# Patient Record
Sex: Female | Born: 1990
Health system: Southern US, Community
[De-identification: ages and names within clinical notes are randomized; demographics above are authoritative.]

## PROBLEM LIST (undated history)

## (undated) DIAGNOSIS — E669 Obesity, unspecified: Secondary | ICD-10-CM

## (undated) HISTORY — PX: TOE SURGERY: SHX1073

---
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
## 2013-08-02 ENCOUNTER — Emergency Department (HOSPITAL_BASED_OUTPATIENT_CLINIC_OR_DEPARTMENT_OTHER)
Admission: EM | Admit: 2013-08-02 | Discharge: 2013-08-02 | Disposition: A | Discharge status: Home / Self Care | Payer: Medicaid Other | Attending: Emergency Medicine | Admitting: Emergency Medicine

## 2013-08-02 ENCOUNTER — Emergency Department (HOSPITAL_BASED_OUTPATIENT_CLINIC_OR_DEPARTMENT_OTHER): Payer: Medicaid Other

## 2013-08-02 ENCOUNTER — Encounter (HOSPITAL_BASED_OUTPATIENT_CLINIC_OR_DEPARTMENT_OTHER): Payer: Self-pay | Admitting: Emergency Medicine

## 2013-08-02 DIAGNOSIS — IMO0002 Reserved for concepts with insufficient information to code with codable children: Secondary | ICD-10-CM | POA: Insufficient documentation

## 2013-08-02 DIAGNOSIS — Z3202 Encounter for pregnancy test, result negative: Secondary | ICD-10-CM | POA: Insufficient documentation

## 2013-08-02 DIAGNOSIS — Z87891 Personal history of nicotine dependence: Secondary | ICD-10-CM | POA: Insufficient documentation

## 2013-08-02 DIAGNOSIS — Z79899 Other long term (current) drug therapy: Secondary | ICD-10-CM | POA: Insufficient documentation

## 2013-08-02 DIAGNOSIS — Z792 Long term (current) use of antibiotics: Secondary | ICD-10-CM | POA: Insufficient documentation

## 2013-08-02 DIAGNOSIS — L0390 Cellulitis, unspecified: Secondary | ICD-10-CM

## 2013-08-02 MED ORDER — CEPHALEXIN 500 MG PO CAPS: 500.0000 mg | ORAL_CAPSULE | Freq: Four times a day (QID) | ORAL | Status: DC

## 2013-08-02 MED ORDER — SULFAMETHOXAZOLE-TRIMETHOPRIM 800-160 MG PO TABS: 1.0000 | ORAL_TABLET | Freq: Two times a day (BID) | ORAL | Status: DC

## 2013-08-02 MED ORDER — IBUPROFEN 400 MG PO TABS: 400.0000 mg | ORAL_TABLET | Freq: Four times a day (QID) | ORAL | Status: DC | PRN

## 2013-08-02 NOTE — Discharge Instructions (Signed)
RETURN TO THE ER IF THE SYMPTOMS ARE GETTING WORSE, OTHERWISE TAKE THE ANTIBIOTICS AND SEE A PRIMARY CARE DOCTOR IN 1 WEEK.   Cellulitis Cellulitis is an infection of the skin and the tissue beneath it. The infected area is usually red and tender. Cellulitis occurs most often in the arms and lower legs.  CAUSES  Cellulitis is caused by bacteria that enter the skin through cracks or cuts in the skin. The most common types of bacteria that cause cellulitis are Staphylococcus and Streptococcus. SYMPTOMS   Redness and warmth.  Swelling.  Tenderness or pain.  Fever. DIAGNOSIS  Your caregiver can usually determine what is wrong based on a physical exam. Blood tests may also be done. TREATMENT  Treatment usually involves taking an antibiotic medicine. HOME CARE INSTRUCTIONS   Take your antibiotics as directed. Finish them even if you start to feel better.  Keep the infected arm or leg elevated to reduce swelling.  Apply a warm cloth to the affected area up to 4 times per day to relieve pain.  Only take over-the-counter or prescription medicines for pain, discomfort, or fever as directed by your caregiver.  Keep all follow-up appointments as directed by your caregiver. SEEK MEDICAL CARE IF:   You notice red streaks coming from the infected area.  Your red area gets larger or turns dark in color.  Your bone or joint underneath the infected area becomes painful after the skin has healed.  Your infection returns in the same area or another area.  You notice a swollen bump in the infected area.  You develop new symptoms. SEEK IMMEDIATE MEDICAL CARE IF:   You have a fever.  You feel very sleepy.  You develop vomiting or diarrhea.  You have a general ill feeling (malaise) with muscle aches and pains. MAKE SURE YOU:   Understand these instructions.  Will watch your condition.  Will get help right away if you are not doing well or get worse. Document Released: 07/21/2005  Document Revised: 04/11/2012 Document Reviewed: 12/27/2011 [facility] Patient Information 2014 [place], [place].

## 2013-08-02 NOTE — ED Notes (Signed)
Redness and pain to left upper arm.  States she thinks she was bitten by a spider Monday while cleaning her room.

## 2013-08-02 NOTE — ED Notes (Signed)
MD at bedside. 

## 2013-08-02 NOTE — ED Provider Notes (Signed)
CSN: [id number]     Arrival date & time 08/02/13  1023 History   First MD Initiated Contact with Patient 08/02/13 1036     Chief Complaint  Patient presents with  . Insect Bite   (Consider location/radiation/quality/duration/timing/severity/associated sxs/prior Treatment) HPI Comments: Pt comes in with cc of insect bite. Pt reports noticing a bump 2 days ago, which quickly spread yday. She suspects an insect biye on Monday. The rash is on the left forearm, no hx of immunocompromised status or ivda. No n/v/f/c.  The history is provided by the patient.    History reviewed. No pertinent past medical history. Past Surgical History  Procedure Laterality Date  . Toe surgery     No family history on file. History  Substance Use Topics  . Smoking status: Former Games developer  . Smokeless tobacco: Not on file  . Alcohol Use: No   OB History   Grav Para Term Preterm Abortions TAB SAB Ect Mult Living                 Review of Systems  Constitutional: Negative for fever, chills and activity change.  Respiratory: Negative for shortness of breath.   Cardiovascular: Negative for chest pain.  Gastrointestinal: Negative for nausea, vomiting and abdominal pain.  Genitourinary: Negative for dysuria.  Musculoskeletal: Negative for neck pain.  Skin: Positive for rash.  Neurological: Negative for headaches.    Allergies  Review of patient's allergies indicates no known allergies.  Home Medications   Current Outpatient Rx  Name  Route  Sig  Dispense  Refill  . cephALEXin (KEFLEX) 500 MG capsule   Oral   Take 1 capsule (500 mg total) by mouth 4 (four) times daily.   40 capsule   0   . ibuprofen (ADVIL,MOTRIN) 400 MG tablet   Oral   Take 1 tablet (400 mg total) by mouth every 6 (six) hours as needed for pain.   30 tablet   0   . sulfamethoxazole-trimethoprim (SEPTRA DS) 800-160 MG per tablet   Oral   Take 1 tablet by mouth 2 (two) times daily.   20 tablet   0    BP 101/53  Pulse  72  Temp(Src) 98.3 F (36.8 C) (Oral)  Resp 16  Ht 5\' 5"  (1.651 m)  Wt 190 lb (86.183 kg)  BMI 31.62 kg/m2  SpO2 99%  LMP 07/03/2013 Physical Exam  Nursing note and vitals reviewed. Constitutional: She is oriented to person, place, and time. She appears well-developed and well-nourished.  HENT:  Head: Normocephalic and atraumatic.  Eyes: EOM are normal. Pupils are equal, round, and reactive to light.  Neck: Neck supple.  Cardiovascular: Normal rate, regular rhythm and normal heart sounds.   No murmur heard. Pulmonary/Chest: Effort normal. No respiratory distress.  Abdominal: Soft. She exhibits no distension. There is no tenderness. There is no rebound and no guarding.  Neurological: She is alert and oriented to person, place, and time.  Skin: Skin is warm and dry.  Left arm - 50% of it is covered in erythematous lesion, with some induration. Warm to touch. No crepitus, fluctuance, drainage. Elbow ROm is intact. The erythema is marked by [place] no circumferential.     ED Course  Procedures (including critical care time) Labs Review Labs Reviewed  PREGNANCY, URINE   Imaging Review Dg Humerus Left  08/02/2013   CLINICAL DATA:  Pain/redness to posterior distal humerus, implanted contraception in arm  EXAM: LEFT HUMERUS - 2+ VIEW  COMPARISON:  None.  FINDINGS: No fracture or dislocation is seen.  The joint spaces are preserved.  Radiopaque contraceptive device within the subcutaneous tissues along the medial/dorsal aspect of the distal humerus.  IMPRESSION: No acute osseous abnormality is seen.  Radiopaque contraceptive device within the subcutaneous tissues along the medial/dorsal aspect of the distal humerus.   Electronically Signed   By: Charline Bills M.D.   On: 08/02/2013 11:31    EKG Interpretation   None       MDM   1. Cellulitis    Pt comes in with cc of skin redness. Pt has cellulitis. Xray ordered, and labs ordered - only as she reports quick spread of the rash.  No free air, doubt nec fascitis, as patient non toxic and immunocompetent.  Likely cellulitis. Will tx. Return precautions discussed.    Derwood Kaplan, MD 08/02/13 [phone]

## 2015-06-26 ENCOUNTER — Emergency Department (HOSPITAL_BASED_OUTPATIENT_CLINIC_OR_DEPARTMENT_OTHER)
Admission: EM | Admit: 2015-06-26 | Discharge: 2015-06-27 | Disposition: A | Discharge status: Home / Self Care | Payer: Medicaid Other | Attending: Emergency Medicine | Admitting: Emergency Medicine

## 2015-06-26 ENCOUNTER — Encounter (HOSPITAL_BASED_OUTPATIENT_CLINIC_OR_DEPARTMENT_OTHER): Payer: Self-pay | Admitting: *Deleted

## 2015-06-26 DIAGNOSIS — Z794 Long term (current) use of insulin: Secondary | ICD-10-CM | POA: Diagnosis not present

## 2015-06-26 DIAGNOSIS — R112 Nausea with vomiting, unspecified: Secondary | ICD-10-CM | POA: Diagnosis present

## 2015-06-26 DIAGNOSIS — R197 Diarrhea, unspecified: Secondary | ICD-10-CM | POA: Diagnosis not present

## 2015-06-26 DIAGNOSIS — Z3202 Encounter for pregnancy test, result negative: Secondary | ICD-10-CM | POA: Diagnosis not present

## 2015-06-26 DIAGNOSIS — Z720 Tobacco use: Secondary | ICD-10-CM | POA: Diagnosis not present

## 2015-06-26 LAB — URINALYSIS, ROUTINE W REFLEX MICROSCOPIC
Bilirubin Urine: NEGATIVE
Glucose, UA: NEGATIVE mg/dL
Ketones, ur: NEGATIVE mg/dL
Nitrite: NEGATIVE
PROTEIN: NEGATIVE mg/dL
Specific Gravity, Urine: 1.013 (ref 1.005–1.030)
UROBILINOGEN UA: 1 mg/dL (ref 0.0–1.0)
pH: 6.5 (ref 5.0–8.0)

## 2015-06-26 LAB — URINE MICROSCOPIC-ADD ON

## 2015-06-26 LAB — PREGNANCY, URINE: Preg Test, Ur: NEGATIVE

## 2015-06-26 MED ORDER — ONDANSETRON 4 MG PO TBDP
4.0000 mg | ORAL_TABLET | Freq: Once | ORAL | Status: AC
  Administered 2015-06-26: 4 mg via ORAL
  Filled 2015-06-26: qty 1

## 2015-06-26 NOTE — ED Notes (Signed)
Reports n/v since 2230 yesterday- states has been vomiting through the night.

## 2015-06-26 NOTE — ED Provider Notes (Signed)
This chart was scribed for Layla Maw Ward, DO by Gwenyth Ober, ED Scribe. This patient was seen in room MH09/MH09 and the patient's care was started at 11:27 PM.   TIME SEEN: 11:27 PM   CHIEF COMPLAINT:  Chief Complaint  Patient presents with  . Emesis   HPI: HPI Comments: Victoria Pruitt is a 24 y.o. female who presents to the Emergency Department complaining of constant, moderate nausea that started yesterday. Pt reports generalized weakness, 3 episodes of diarrhea and multiple episodes of vomiting as associated symptoms. She last vomited 15 hours ago. Pt states that her 1-year-old child had similar symptoms and is in daycare. She is currently menstruating. Pt states her current menses is late, but that she has irregular menses. She denies recent international travel or antibiotic treatment. Pt also denies fever as an associated symptom. States she does have abdominal cramping. Denies dysuria, hematuria, vaginal discharge.  ROS: See HPI Constitutional: no fever  Eyes: no drainage  ENT: no runny nose   Cardiovascular:  no chest pain  Resp: no SOB  GI: vomiting GU: no dysuria Integumentary: no rash  Allergy: no hives  Musculoskeletal: no leg swelling  Neurological: no slurred speech ROS otherwise negative  PAST MEDICAL HISTORY/PAST SURGICAL HISTORY:  History reviewed. No pertinent past medical history.  MEDICATIONS:  Prior to Admission medications   Medication Sig Start Date End Date Taking? Authorizing Provider  cephALEXin (KEFLEX) 500 MG capsule Take 1 capsule (500 mg total) by mouth 4 (four) times daily. 08/02/13   Derwood Kaplan, MD  ibuprofen (ADVIL,MOTRIN) 400 MG tablet Take 1 tablet (400 mg total) by mouth every 6 (six) hours as needed for pain. 08/02/13   Derwood Kaplan, MD  sulfamethoxazole-trimethoprim (SEPTRA DS) 800-160 MG per tablet Take 1 tablet by mouth 2 (two) times daily. 08/02/13   Derwood Kaplan, MD    ALLERGIES:  No Known Allergies  SOCIAL HISTORY:   Social History  Substance Use Topics  . Smoking status: Current Every Day Smoker    Types: Cigars  . Smokeless tobacco: Never Used  . Alcohol Use: Yes     Comment: 1 drink /weekends    FAMILY HISTORY: No family history on file.  EXAM: BP 109/69 mmHg  Pulse 89  Temp(Src) 99 F (37.2 C) (Oral)  Resp 20  Ht  (1.676 m)  Wt 218 lb (98.884 kg)  BMI 35.20 kg/m2  SpO2 98% CONSTITUTIONAL: Alert and oriented and responds appropriately to questions. Well-appearing; well-nourished; Smiling, afebrile and nontoxic; Appears well-hydrated HEAD: Normocephalic EYES: Conjunctivae clear, PERRL ENT: normal nose; no rhinorrhea; moist mucous membranes; pharynx without lesions noted NECK: Supple, no meningismus, no LAD  CARD: RRR; S1 and S2 appreciated; no murmurs, no clicks, no rubs, no gallops RESP: Normal chest excursion without splinting or tachypnea; breath sounds clear and equal bilaterally; no wheezes, no rhonchi, no rales ABD/GI: Normal bowel sounds; non-distended; soft, non-tender, no rebound, no guarding BACK:  The back appears normal and is non-tender to palpation, there is no CVA tenderness EXT: Normal ROM in all joints; non-tender to palpation; no edema; normal capillary refill; no cyanosis    SKIN: Normal color for age and race; warm NEURO: Moves all extremities equally PSYCH: The patient's mood and manner are appropriate. Grooming and personal hygiene are appropriate.  MEDICAL DECISION MAKING: Patient here with nausea, vomiting and diarrhea. Is very well-appearing on exam with benign abdominal exam. Patient feels better after Zofran and Bentyl and is tolerating by mouth without difficulty. Urine shows large  hemoglobin likely from her menstrual cycle but also leukocytes, bacteria and squamous cells. Suspect dirty catch. She is not having urinary symptoms. Pregnancy test is negative. Suspect viral gastroenteritis. We'll discharge with prescription for Zofran. Have recommended  increased fluid intake.    When patient is about to leave the emergency department she begins telling me that she has had a vaginal odor that is similar to when she has had bacterial vaginosis in the past. She declines pelvic exam and is requesting Flagyl which has helped her before. We'll give her Flagyl but have advised her to follow-up with her OB/GYN, PCP if symptoms worsen.  She verbalizes understanding is comfortable with plan.  I personally performed the services described in this documentation, which was scribed in my presence. The recorded information has been reviewed and is accurate.    Layla Maw Ward, DO 06/27/15 [phone]

## 2015-06-27 MED ORDER — PROMETHAZINE HCL 25 MG PO TABS: 25.0000 mg | ORAL_TABLET | Freq: Four times a day (QID) | ORAL | Status: DC | PRN

## 2015-06-27 MED ORDER — DICYCLOMINE HCL 10 MG PO CAPS
20.0000 mg | ORAL_CAPSULE | Freq: Once | ORAL | Status: AC
  Administered 2015-06-27: 20 mg via ORAL
  Filled 2015-06-27: qty 2

## 2015-06-27 MED ORDER — ONDANSETRON 4 MG PO TBDP: 4.0000 mg | ORAL_TABLET | Freq: Three times a day (TID) | ORAL | Status: DC | PRN

## 2015-06-27 MED ORDER — METRONIDAZOLE 500 MG PO TABS
500.0000 mg | ORAL_TABLET | Freq: Once | ORAL | Status: AC
  Administered 2015-06-27: 500 mg via ORAL
  Filled 2015-06-27: qty 1

## 2015-06-27 MED ORDER — METRONIDAZOLE 500 MG PO TABS: 500.0000 mg | ORAL_TABLET | Freq: Two times a day (BID) | ORAL | Status: DC

## 2015-06-27 NOTE — ED Notes (Addendum)
No changes. Pt alert, NAD, calm, interactive, resps e/u, speaking in clear complete sentences, reports nausea lessened, pain remains, also reports recent v,d, subjective fever, last ate Wednesday, last BM Thursday am (describes diarrhea as green, soft & watery), describes emesis as >10x in last 24 hrs (yellow), LMP 7/19, mentions h/o BV.

## 2015-06-27 NOTE — ED Notes (Signed)
Dr. Ward at BS.

## 2015-06-27 NOTE — Discharge Instructions (Signed)
Viral Gastroenteritis °Viral gastroenteritis is also known as stomach flu. This condition affects the stomach and intestinal tract. It can cause sudden diarrhea and vomiting. The illness typically lasts 3 to 8 days. Most people develop an immune response that eventually gets rid of the virus. While this natural response develops, the virus can make you quite ill. °CAUSES  °Many different viruses can cause gastroenteritis, such as rotavirus or noroviruses. You can catch one of these viruses by consuming contaminated food or water. You may also catch a virus by sharing utensils or other personal items with an infected person or by touching a contaminated surface. °SYMPTOMS  °The most common symptoms are diarrhea and vomiting. These problems can cause a severe loss of body fluids (dehydration) and a body salt (electrolyte) imbalance. Other symptoms may include: °· Fever. °· Headache. °· Fatigue. °· Abdominal pain. °DIAGNOSIS  °Your caregiver can usually diagnose viral gastroenteritis based on your symptoms and a physical exam. A stool sample may also be taken to test for the presence of viruses or other infections. °TREATMENT  °This illness typically goes away on its own. Treatments are aimed at rehydration. The most serious cases of viral gastroenteritis involve vomiting so severely that you are not able to keep fluids down. In these cases, fluids must be given through an intravenous line (IV). °HOME CARE INSTRUCTIONS  °· Drink enough fluids to keep your urine clear or pale yellow. Drink small amounts of fluids frequently and increase the amounts as tolerated. °· Ask your caregiver for specific rehydration instructions. °· Avoid: °¨ Foods high in sugar. °¨ Alcohol. °¨ Carbonated drinks. °¨ Tobacco. °¨ Juice. °¨ Caffeine drinks. °¨ Extremely hot or cold fluids. °¨ Fatty, greasy foods. °¨ Too much intake of anything at one time. °¨ Dairy products until 24 to 48 hours after diarrhea stops. °· You may consume probiotics.  Probiotics are active cultures of beneficial bacteria. They may lessen the amount and number of diarrheal stools in adults. Probiotics can be found in yogurt with active cultures and in supplements. °· Wash your hands well to avoid spreading the virus. °· Only take over-the-counter or prescription medicines for pain, discomfort, or fever as directed by your caregiver. Do not give aspirin to children. Antidiarrheal medicines are not recommended. °· Ask your caregiver if you should continue to take your regular prescribed and over-the-counter medicines. °· Keep all follow-up appointments as directed by your caregiver. °SEEK IMMEDIATE MEDICAL CARE IF:  °· You are unable to keep fluids down. °· You do not urinate at least once every 6 to 8 hours. °· You develop shortness of breath. °· You notice blood in your stool or vomit. This may look like coffee grounds. °· You have abdominal pain that increases or is concentrated in one small area (localized). °· You have persistent vomiting or diarrhea. °· You have a fever. °· The patient is a child younger than 3 months, and he or she has a fever. °· The patient is a child older than 3 months, and he or she has a fever and persistent symptoms. °· The patient is a child older than 3 months, and he or she has a fever and symptoms suddenly get worse. °· The patient is a baby, and he or she has no tears when crying. °MAKE SURE YOU:  °· Understand these instructions. °· Will watch your condition. °· Will get help right away if you are not doing well or get worse. °Document Released: 10/11/2005 Document Revised: 01/03/2012 Document Reviewed: 07/28/2011 °  ExitCare® Patient Information ©2015 [employer]. This information is not intended to replace advice given to you by your health care provider. Make sure you discuss any questions you have with your health care provider. °Bacterial Vaginosis °Bacterial vaginosis is a vaginal infection that occurs when the normal balance of bacteria in  the vagina is disrupted. It results from an overgrowth of certain bacteria. This is the most common vaginal infection in women of childbearing age. Treatment is important to prevent complications, especially in pregnant women, as it can cause a premature delivery. °CAUSES  °Bacterial vaginosis is caused by an increase in harmful bacteria that are normally present in smaller amounts in the vagina. Several different kinds of bacteria can cause bacterial vaginosis. However, the reason that the condition develops is not fully understood. °RISK FACTORS °Certain activities or behaviors can put you at an increased risk of developing bacterial vaginosis, including: °· Having a new sex partner or multiple sex partners. °· Douching. °· Using an intrauterine device (IUD) for contraception. °Women do not get bacterial vaginosis from toilet seats, bedding, swimming pools, or contact with objects around them. °SIGNS AND SYMPTOMS  °Some women with bacterial vaginosis have no signs or symptoms. Common symptoms include: °· Grey vaginal discharge. °· A fishlike odor with discharge, especially after sexual intercourse. °· Itching or burning of the vagina and vulva. °· Burning or pain with urination. °DIAGNOSIS  °Your health care provider will take a medical history and examine the vagina for signs of bacterial vaginosis. A sample of vaginal fluid may be taken. Your health care provider will look at this sample under a microscope to check for bacteria and abnormal cells. A vaginal pH test may also be done.  °TREATMENT  °Bacterial vaginosis may be treated with antibiotic medicines. These may be given in the form of a pill or a vaginal cream. A second round of antibiotics may be prescribed if the condition comes back after treatment.  °HOME CARE INSTRUCTIONS  °· Only take over-the-counter or prescription medicines as directed by your health care provider. °· If antibiotic medicine was prescribed, take it as directed. Make sure you finish  it even if you start to feel better. °· Do not have sex until treatment is completed. °· Tell all sexual partners that you have a vaginal infection. They should see their health care provider and be treated if they have problems, such as a mild rash or itching. °· Practice safe sex by using condoms and only having one sex partner. °SEEK MEDICAL CARE IF:  °· Your symptoms are not improving after 3 days of treatment. °· You have increased discharge or pain. °· You have a fever. °MAKE SURE YOU:  °· Understand these instructions. °· Will watch your condition. °· Will get help right away if you are not doing well or get worse. °FOR MORE INFORMATION  °Centers for Disease Control and Prevention, Division of STD Prevention: www.cdc.gov/std °American Sexual Health Association (ASHA): www.ashastd.org  °Document Released: 10/11/2005 Document Revised: 08/01/2013 Document Reviewed: 05/23/2013 °ExitCare® Patient Information ©2015 [employer]. This information is not intended to replace advice given to you by your health care provider. Make sure you discuss any questions you have with your health care provider. ° °

## 2015-07-22 ENCOUNTER — Encounter (HOSPITAL_BASED_OUTPATIENT_CLINIC_OR_DEPARTMENT_OTHER): Payer: Self-pay | Admitting: *Deleted

## 2015-07-22 ENCOUNTER — Emergency Department (HOSPITAL_BASED_OUTPATIENT_CLINIC_OR_DEPARTMENT_OTHER)
Admission: EM | Admit: 2015-07-22 | Discharge: 2015-07-22 | Disposition: A | Discharge status: Home / Self Care | Payer: Medicaid Other | Attending: Emergency Medicine | Admitting: Emergency Medicine

## 2015-07-22 DIAGNOSIS — Z720 Tobacco use: Secondary | ICD-10-CM | POA: Diagnosis not present

## 2015-07-22 DIAGNOSIS — Z9889 Other specified postprocedural states: Secondary | ICD-10-CM | POA: Diagnosis not present

## 2015-07-22 DIAGNOSIS — Z792 Long term (current) use of antibiotics: Secondary | ICD-10-CM | POA: Insufficient documentation

## 2015-07-22 DIAGNOSIS — M79671 Pain in right foot: Secondary | ICD-10-CM | POA: Diagnosis present

## 2015-07-22 MED ORDER — IBUPROFEN 400 MG PO TABS: 400.0000 mg | ORAL_TABLET | Freq: Four times a day (QID) | ORAL | Status: DC | PRN

## 2015-07-22 NOTE — ED Notes (Signed)
Right foot pain today. No known injury.

## 2015-07-22 NOTE — Discharge Instructions (Signed)
RICE: Routine Care for Injuries The routine care of many injuries includes Rest, Ice, Compression, and Elevation (RICE). HOME CARE INSTRUCTIONS  Rest is needed to allow your body to heal. Routine activities can usually be resumed when comfortable. Injured tendons and bones can take up to 6 weeks to heal. Tendons are the cord-like structures that attach muscle to bone.  Ice following an injury helps keep the swelling down and reduces pain.  Put ice in a plastic bag.  Place a towel between your skin and the bag.  Leave the ice on for 15-20 minutes, 3-4 times a day, or as directed by your health care provider. Do this while awake, for the first 24 to 48 hours. After that, continue as directed by your caregiver.  Compression helps keep swelling down. It also gives support and helps with discomfort. If an elastic bandage has been applied, it should be removed and reapplied every 3 to 4 hours. It should not be applied tightly, but firmly enough to keep swelling down. Watch fingers or toes for swelling, bluish discoloration, coldness, numbness, or excessive pain. If any of these problems occur, remove the bandage and reapply loosely. Contact your caregiver if these problems continue.  Elevation helps reduce swelling and decreases pain. With extremities, such as the arms, hands, legs, and feet, the injured area should be placed near or above the level of the heart, if possible. SEEK IMMEDIATE MEDICAL CARE IF:  You have persistent pain and swelling.  You develop redness, numbness, or unexpected weakness.  Your symptoms are getting worse rather than improving after several days. These symptoms may indicate that further evaluation or further X-rays are needed. Sometimes, X-rays may not show a small broken bone (fracture) until 1 week or 10 days later. Make a follow-up appointment with your caregiver. Ask when your X-ray results will be ready. Make sure you get your X-ray results. Document Released:  01/23/2001 Document Revised: 10/16/2013 Document Reviewed: 03/12/2011 Exit[employer] Patient Information 2015 [employer]. This information is not intended to replace advice given to you by your health care provider. Make sure you discuss any questions you have with your health care provider.  

## 2015-07-22 NOTE — ED Provider Notes (Signed)
CSN: [id number]     Arrival date & time 07/22/15  1855 History   First MD Initiated Contact with Patient 07/22/15 1908     Chief Complaint  Patient presents with  . Foot Pain     (Consider location/radiation/quality/duration/timing/severity/associated sxs/prior Treatment) HPI   24 year old female who presents for evaluation of right foot pain. Patient report for the past 3-4 hours she has had pain to the inner aspects of her right foot. She described pain as a sharp achy throbbing sensation, 4 out of 10, nothing seems to make it better or worse. Pain is nonradiating. No associated fever, numbness, or rash. She denies any specific injury. She denies changing her shoes are any strenuous activities. No history of diabetes. She has a remote history of right great toe injury requiring surgery when she was 24 years old. No complaints of right ankle pain, calf pain, or knee pain. No no prior history of PE or DVT, recent surgery, prolonged bed rest, or active cancer.    History reviewed. No pertinent past medical history. Past Surgical History  Procedure Laterality Date  . Toe surgery     No family history on file. Social History  Substance Use Topics  . Smoking status: Current Every Day Smoker    Types: Cigars  . Smokeless tobacco: Never Used  . Alcohol Use: Yes     Comment: 1 drink /weekends   OB History    No data available     Review of Systems  Constitutional: Negative for fever.  Skin: Negative for rash and wound.  Neurological: Negative for numbness.      Allergies  Review of patient's allergies indicates no known allergies.  Home Medications   Prior to Admission medications   Medication Sig Start Date End Date Taking? Authorizing Provider  cephALEXin (KEFLEX) 500 MG capsule Take 1 capsule (500 mg total) by mouth 4 (four) times daily. 08/02/13   Derwood Kaplan, MD  ibuprofen (ADVIL,MOTRIN) 400 MG tablet Take 1 tablet (400 mg total) by mouth every 6 (six) hours as needed  for pain. 08/02/13   Derwood Kaplan, MD  metroNIDAZOLE (FLAGYL) 500 MG tablet Take 1 tablet (500 mg total) by mouth 2 (two) times daily. Do not drink alcohol with this medication. 06/27/15   Kristen N Ward, DO  ondansetron (ZOFRAN ODT) 4 MG disintegrating tablet Take 1 tablet (4 mg total) by mouth every 8 (eight) hours as needed for nausea or vomiting. 06/27/15   Kristen N Ward, DO  promethazine (PHENERGAN) 25 MG tablet Take 1 tablet (25 mg total) by mouth every 6 (six) hours as needed for nausea or vomiting. 06/27/15   Kristen N Ward, DO  sulfamethoxazole-trimethoprim (SEPTRA DS) 800-160 MG per tablet Take 1 tablet by mouth 2 (two) times daily. 08/02/13   Ankit Nanavati, MD   BP 109/56 mmHg  Pulse 95  Temp(Src) 98.6 F (37 C) (Oral)  Resp 18  Ht 5' 6.5" (1.689 m)  Wt 215 lb (97.523 kg)  BMI 34.19 kg/m2  SpO2 99% Physical Exam  Constitutional: She appears well-developed and well-nourished. No distress.  HENT:  Head: Atraumatic.  Eyes: Conjunctivae are normal.  Neck: Neck supple.  Musculoskeletal: She exhibits tenderness (Right foot: Mild tenderness to first metatarsal region on palpation without any overlying skin changes or crepitus. Pedal pulse intact, brisk cap refills, no toes tenderness, and no deformity.). She exhibits no edema.  Right ankle and right knee are nontender. No calf tenderness, and no pedal edema.  Patient ambulate without difficulty.  Neurological: She is alert.  Skin: No rash noted.  Psychiatric: She has a normal mood and affect.  Nursing note and vitals reviewed.   ED Course  Procedures (including critical care time)  Isolated right foot pain without any specific injury. No risk factors for DVT. No evidence of infection concerning for septic joint, no foreign object noted. Patient ambulate without difficulty. She is neurovascularly intact. Recommend ibuprofen, rales, outpatient follow-up with orthopedic as needed. Return precautions discussed.    MDM   Final  diagnoses:  Right foot pain    BP 109/56 mmHg  Pulse 95  Temp(Src) 98.6 F (37 C) (Oral)  Resp 18  Ht 5' 6.5" (1.689 m)  Wt 215 lb (97.523 kg)  BMI 34.19 kg/m2  SpO2 99%     Fayrene Helper, PA-C 07/22/15 1922  Lavera Guise, MD 07/23/15 [phone]

## 2015-11-03 ENCOUNTER — Encounter (HOSPITAL_BASED_OUTPATIENT_CLINIC_OR_DEPARTMENT_OTHER): Payer: Self-pay | Admitting: *Deleted

## 2015-11-03 ENCOUNTER — Emergency Department (HOSPITAL_BASED_OUTPATIENT_CLINIC_OR_DEPARTMENT_OTHER)
Admission: EM | Admit: 2015-11-03 | Discharge: 2015-11-03 | Disposition: A | Discharge status: Home / Self Care | Payer: Medicaid Other | Attending: Emergency Medicine | Admitting: Emergency Medicine

## 2015-11-03 DIAGNOSIS — J069 Acute upper respiratory infection, unspecified: Secondary | ICD-10-CM | POA: Diagnosis not present

## 2015-11-03 DIAGNOSIS — H938X9 Other specified disorders of ear, unspecified ear: Secondary | ICD-10-CM | POA: Diagnosis not present

## 2015-11-03 DIAGNOSIS — R05 Cough: Secondary | ICD-10-CM | POA: Diagnosis present

## 2015-11-03 DIAGNOSIS — F17210 Nicotine dependence, cigarettes, uncomplicated: Secondary | ICD-10-CM | POA: Insufficient documentation

## 2015-11-03 MED ORDER — ALBUTEROL SULFATE HFA 108 (90 BASE) MCG/ACT IN AERS
2.0000 | INHALATION_SPRAY | RESPIRATORY_TRACT | Status: DC
  Administered 2015-11-03: 2 via RESPIRATORY_TRACT
  Filled 2015-11-03: qty 6.7

## 2015-11-03 NOTE — ED Provider Notes (Signed)
CSN: [id number]     Arrival date & time 11/03/15  1351 History   First MD Initiated Contact with Patient 11/03/15 1402     Chief Complaint  Patient presents with  . Cough     HPI Patient presents complaining of upper respiratory symptoms of the past 1-2 days with mild ear fullness, sore throat, cough, nasal congestion.  Recent sick contacts with similar symptoms.  No documented fever at home.  Denies nausea vomiting or diarrhea.  Symptoms are mild in severity.  No other complaints.  Nothing worsens or improves her symptoms   History reviewed. No pertinent past medical history. Past Surgical History  Procedure Laterality Date  . Toe surgery     History reviewed. No pertinent family history. Social History  Substance Use Topics  . Smoking status: Current Every Day Smoker    Types: Cigars  . Smokeless tobacco: Never Used  . Alcohol Use: Yes     Comment: 1 drink /weekends   OB History    No data available     Review of Systems  All other systems reviewed and are negative.     Allergies  Review of patient's allergies indicates no known allergies.  Home Medications   Prior to Admission medications   Not on File   BP 93/62 mmHg  Pulse 112  Temp(Src) 99.4 F (37.4 C) (Oral)  Resp 18  Ht 5\' 6"  (1.676 m)  Wt 220 lb (99.791 kg)  BMI 35.53 kg/m2  SpO2 100%  LMP 10/08/2015 Physical Exam  Constitutional: She is oriented to person, place, and time. She appears well-developed and well-nourished. No distress.  HENT:  Head: Normocephalic and atraumatic.  Bilateral TMs are normal.  Uvula midline.  No tonsillar swelling or exudate.  Eyes: EOM are normal.  Neck: Normal range of motion.  Cardiovascular: Normal rate, regular rhythm and normal heart sounds.   Pulmonary/Chest: Effort normal and breath sounds normal.  Abdominal: Soft. She exhibits no distension. There is no tenderness.  Musculoskeletal: Normal range of motion.  Neurological: She is alert and oriented to person,  place, and time.  Skin: Skin is warm and dry.  Psychiatric: She has a normal mood and affect. Judgment normal.  Nursing note and vitals reviewed.   ED Course  Procedures (including critical care time) Labs Review Labs Reviewed - No data to display  Imaging Review No results found. I have personally reviewed and evaluated these images and lab results as part of my medical decision-making.   EKG Interpretation None      MDM   Final diagnoses:  Upper respiratory tract infection    Overall well appearing.  Nontoxic.  Likely upper respiratory tract infection.  Symptomatically treatment at home.  Recommend ongoing oral hydration at home    Azalia BilisKevin Velina Drollinger, MD 11/03/15 1439

## 2015-11-03 NOTE — ED Notes (Signed)
Pt amb to triage with quick steady gait in nad, reporting cough and subjective temps x yesterday.

## 2015-11-03 NOTE — Discharge Instructions (Signed)
Upper Respiratory Infection, Adult Most upper respiratory infections (URIs) are a viral infection of the air passages leading to the lungs. A URI affects the nose, throat, and upper air passages. The most common type of URI is nasopharyngitis and is typically referred to as "the common cold." URIs run their course and usually go away on their own. Most of the time, a URI does not require medical attention, but sometimes a bacterial infection in the upper airways can follow a viral infection. This is called a secondary infection. Sinus and middle ear infections are common types of secondary upper respiratory infections. Bacterial pneumonia can also complicate a URI. A URI can worsen asthma and chronic obstructive pulmonary disease (COPD). Sometimes, these complications can require emergency medical care and may be life threatening.  CAUSES Almost all URIs are caused by viruses. A virus is a type of germ and can spread from one person to another.  RISKS FACTORS You may be at risk for a URI if:   You smoke.   You have chronic heart or lung disease.  You have a weakened defense (immune) system.   You are very young or very old.   You have nasal allergies or asthma.  You work in crowded or poorly ventilated areas.  You work in health care facilities or schools. SIGNS AND SYMPTOMS  Symptoms typically develop 2-3 days after you come in contact with a cold virus. Most viral URIs last 7-10 days. However, viral URIs from the influenza virus (flu virus) can last 14-18 days and are typically more severe. Symptoms may include:   Runny or stuffy (congested) nose.   Sneezing.   Cough.   Sore throat.   Headache.   Fatigue.   Fever.   Loss of appetite.   Pain in your forehead, behind your eyes, and over your cheekbones (sinus pain).  Muscle aches.  DIAGNOSIS  Your health care provider may diagnose a URI by:  Physical exam.  Tests to check that your symptoms are not due to  another condition such as:  Strep throat.  Sinusitis.  Pneumonia.  Asthma. TREATMENT  A URI goes away on its own with time. It cannot be cured with medicines, but medicines may be prescribed or recommended to relieve symptoms. Medicines may help:  Reduce your fever.  Reduce your cough.  Relieve nasal congestion. HOME CARE INSTRUCTIONS   Take medicines only as directed by your health care provider.   Gargle warm saltwater or take cough drops to comfort your throat as directed by your health care provider.  Use a warm mist humidifier or inhale steam from a shower to increase air moisture. This may make it easier to breathe.  Drink enough fluid to keep your urine clear or pale yellow.   Eat soups and other clear broths and maintain good nutrition.   Rest as needed.   Return to work when your temperature has returned to normal or as your health care provider advises. You may need to stay home longer to avoid infecting others. You can also use a face mask and careful hand washing to prevent spread of the virus.  Increase the usage of your inhaler if you have asthma.   Do not use any tobacco products, including cigarettes, chewing tobacco, or electronic cigarettes. If you need help quitting, ask your health care provider. PREVENTION  The best way to protect yourself from getting a cold is to practice good hygiene.   Avoid oral or hand contact with people with cold   symptoms.   Wash your hands often if contact occurs.  There is no clear evidence that vitamin C, vitamin E, echinacea, or exercise reduces the chance of developing a cold. However, it is always recommended to get plenty of rest, exercise, and practice good nutrition.  SEEK MEDICAL CARE IF:   You are getting worse rather than better.   Your symptoms are not controlled by medicine.   You have chills.  You have worsening shortness of breath.  You have brown or red mucus.  You have yellow or brown nasal  discharge.  You have pain in your face, especially when you bend forward.  You have a fever.  You have swollen neck glands.  You have pain while swallowing.  You have white areas in the back of your throat. SEEK IMMEDIATE MEDICAL CARE IF:   You have severe or persistent:  Headache.  Ear pain.  Sinus pain.  Chest pain.  You have chronic lung disease and any of the following:  Wheezing.  Prolonged cough.  Coughing up blood.  A change in your usual mucus.  You have a stiff neck.  You have changes in your:  Vision.  Hearing.  Thinking.  Mood. MAKE SURE YOU:   Understand these instructions.  Will watch your condition.  Will get help right away if you are not doing well or get worse.   This information is not intended to replace advice given to you by your health care provider. Make sure you discuss any questions you have with your health care provider.   Document Released: 04/06/2001 Document Revised: 02/25/2015 Document Reviewed: 01/16/2014 Elsevier Interactive Patient Education 2016 [employer]  

## 2017-01-07 ENCOUNTER — Emergency Department (HOSPITAL_BASED_OUTPATIENT_CLINIC_OR_DEPARTMENT_OTHER)
Admission: EM | Admit: 2017-01-07 | Discharge: 2017-01-07 | Disposition: A | Discharge status: Home / Self Care | Payer: Medicaid Other | Attending: Emergency Medicine | Admitting: Emergency Medicine

## 2017-01-07 ENCOUNTER — Emergency Department (HOSPITAL_BASED_OUTPATIENT_CLINIC_OR_DEPARTMENT_OTHER): Payer: Medicaid Other

## 2017-01-07 ENCOUNTER — Encounter (HOSPITAL_BASED_OUTPATIENT_CLINIC_OR_DEPARTMENT_OTHER): Payer: Self-pay | Admitting: Emergency Medicine

## 2017-01-07 DIAGNOSIS — Y92410 Unspecified street and highway as the place of occurrence of the external cause: Secondary | ICD-10-CM | POA: Insufficient documentation

## 2017-01-07 DIAGNOSIS — F17290 Nicotine dependence, other tobacco product, uncomplicated: Secondary | ICD-10-CM | POA: Diagnosis not present

## 2017-01-07 DIAGNOSIS — R51 Headache: Secondary | ICD-10-CM | POA: Insufficient documentation

## 2017-01-07 DIAGNOSIS — Y9389 Activity, other specified: Secondary | ICD-10-CM | POA: Diagnosis not present

## 2017-01-07 DIAGNOSIS — S8992XA Unspecified injury of left lower leg, initial encounter: Secondary | ICD-10-CM | POA: Diagnosis present

## 2017-01-07 DIAGNOSIS — Y999 Unspecified external cause status: Secondary | ICD-10-CM | POA: Diagnosis not present

## 2017-01-07 DIAGNOSIS — M25562 Pain in left knee: Secondary | ICD-10-CM | POA: Insufficient documentation

## 2017-01-07 MED ORDER — METHOCARBAMOL 500 MG PO TABS: 500.0000 mg | ORAL_TABLET | Freq: Two times a day (BID) | ORAL | 0 refills | Status: DC

## 2017-01-07 MED ORDER — IBUPROFEN 800 MG PO TABS: 800.0000 mg | ORAL_TABLET | Freq: Three times a day (TID) | ORAL | 0 refills | Status: DC

## 2017-01-07 MED ORDER — KETOROLAC TROMETHAMINE 60 MG/2ML IM SOLN
60.0000 mg | Freq: Once | INTRAMUSCULAR | Status: AC
  Administered 2017-01-07: 60 mg via INTRAMUSCULAR
  Filled 2017-01-07: qty 2

## 2017-01-07 MED ORDER — LIDOCAINE 5 % EX PTCH: 1.0000 | MEDICATED_PATCH | CUTANEOUS | 0 refills | Status: DC
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
## 2017-01-07 NOTE — Discharge Instructions (Addendum)
Expect your soreness to increase over the next 2-3 days. Take it easy, but do not lay around too much as this may make any stiffness worse.  °Antiinflammatory medications: Take 500 mg of naproxen every 12 hours or 800 mg of ibuprofen every 8 hours for the next 3 days. Take these medications with food to avoid upset stomach. Choose only one of these medications, do not take them together. ° °Muscle relaxer: Robaxin is a muscle relaxer and may help loosen stiff muscles. Do not take the Robaxin while driving or performing other dangerous activities.  ° °Lidocaine patches: These are available via either prescription or over-the-counter. The over-the-counter option may be more economical one and are likely just as effective. There are multiple over-the-counter brands, such as Salonpas. ° °Exercises: Be sure to perform the attached exercises starting with three times a week and working up to performing them daily. This is an essential part of preventing long term problems.  ° °Follow up with a primary care provider for any future management of these complaints. °
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
## 2017-01-07 NOTE — ED Notes (Signed)
ED Provider at bedside. 
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
## 2018-08-01 ENCOUNTER — Emergency Department (HOSPITAL_BASED_OUTPATIENT_CLINIC_OR_DEPARTMENT_OTHER)
Admission: EM | Admit: 2018-08-01 | Discharge: 2018-08-01 | Disposition: A | Discharge status: Home / Self Care | Payer: Medicaid Other | Attending: Emergency Medicine | Admitting: Emergency Medicine

## 2018-08-01 ENCOUNTER — Other Ambulatory Visit: Payer: Self-pay

## 2018-08-01 ENCOUNTER — Emergency Department (HOSPITAL_BASED_OUTPATIENT_CLINIC_OR_DEPARTMENT_OTHER): Payer: Medicaid Other

## 2018-08-01 ENCOUNTER — Encounter (HOSPITAL_BASED_OUTPATIENT_CLINIC_OR_DEPARTMENT_OTHER): Payer: Self-pay | Admitting: Emergency Medicine

## 2018-08-01 DIAGNOSIS — J069 Acute upper respiratory infection, unspecified: Secondary | ICD-10-CM

## 2018-08-01 DIAGNOSIS — F17210 Nicotine dependence, cigarettes, uncomplicated: Secondary | ICD-10-CM | POA: Insufficient documentation

## 2018-08-01 HISTORY — DX: Obesity, unspecified: E66.9

## 2018-08-01 MED ORDER — ALBUTEROL SULFATE HFA 108 (90 BASE) MCG/ACT IN AERS
2.0000 | INHALATION_SPRAY | Freq: Once | RESPIRATORY_TRACT | Status: AC
  Administered 2018-08-01: 2 via RESPIRATORY_TRACT
  Filled 2018-08-01: qty 6.7

## 2018-08-01 MED ORDER — AMOXICILLIN-POT CLAVULANATE 875-125 MG PO TABS: 1.0000 | ORAL_TABLET | Freq: Two times a day (BID) | ORAL | 0 refills | Status: AC

## 2018-08-01 MED FILL — AMOX-CLAV 875-125 MG TABLET: 875-125 | 7 days supply | Qty: 14 | Fill #0

## 2018-08-01 NOTE — ED Provider Notes (Signed)
[facility] EMERGENCY DEPARTMENT Provider Note   CSN: [id number] Arrival date & time: 08/01/18  0840     History   Chief Complaint Chief Complaint  Patient presents with  . URI    HPI Deiondra ADALIE MAND is a 27 y.o. female.  HPI  Presents with congestion, cough, sneezing. Other workers with cough and sneezing. Felt congestion, then coughing, productive of yellow sputum. Mouth dry. Sinus headache.  When wake up in AM having eyes running it itchy. One time had sinus infection that went to eye. Taking dayquil, children's benadryl Now having chest pain, started last week, worse with coughing Feeling wheezing sometimes. One week of symptoms.   No asthma Smokes  Past Medical History:  Diagnosis Date  . Obesity     There are no active problems to display for this patient.   Past Surgical History:  Procedure Laterality Date  . TOE SURGERY       OB History   None      Home Medications    Prior to Admission medications   Medication Sig Start Date End Date Taking? Authorizing Provider  amoxicillin-clavulanate (AUGMENTIN) 875-125 MG tablet Take 1 tablet by mouth 2 (two) times daily for 7 days. 08/01/18 08/08/18  Alvira Monday, MD    Family History No family history on file.  Social History Social History   Tobacco Use  . Smoking status: Current Every Day Smoker    Types: Cigars  . Smokeless tobacco: Never Used  Substance Use Topics  . Alcohol use: Yes    Comment: 1 drink /weekends  . Drug use: No     Allergies   Patient has no known allergies.   Review of Systems Review of Systems  Constitutional: Positive for chills. Negative for fever.  HENT: Positive for congestion and sore throat.   Eyes: Negative for visual disturbance.  Respiratory: Positive for cough and wheezing. Negative for shortness of breath.   Cardiovascular: Positive for chest pain.  Gastrointestinal: Negative for abdominal pain, nausea and vomiting.    Genitourinary: Negative for difficulty urinating.  Musculoskeletal: Negative for back pain and neck pain.  Skin: Negative for rash.  Neurological: Negative for syncope and headaches.     Physical Exam Updated Vital Signs BP 113/70 (BP Location: Right Arm)   Pulse 96   Temp 98.5 F (36.9 C) (Oral)   Resp 12   Ht 5\' 5"  (1.651 m)   Wt 108.9 kg   LMP 07/21/2018 Comment: Very irregular periods  SpO2 100%   BMI 39.94 kg/m   Physical Exam  Constitutional: She is oriented to person, place, and time. She appears well-developed and well-nourished. No distress.  HENT:  Head: Normocephalic and atraumatic.  Maxillary sinus tenderness  Eyes: Conjunctivae and EOM are normal.  Neck: Normal range of motion.  Cardiovascular: Normal rate, regular rhythm, normal heart sounds and intact distal pulses. Exam reveals no gallop and no friction rub.  No murmur heard. Pulmonary/Chest: Effort normal and breath sounds normal. No respiratory distress. She has no wheezes. She has no rales.  Abdominal: Soft. She exhibits no distension. There is no tenderness. There is no guarding.  Musculoskeletal: She exhibits no edema or tenderness.  Neurological: She is alert and oriented to person, place, and time.  Skin: Skin is warm and dry. No rash noted. She is not diaphoretic. No erythema.  Nursing note and vitals reviewed.    ED Treatments / Results  Labs (all labs ordered are listed, but only abnormal results are displayed)  Labs Reviewed - No data to display  EKG None  Radiology Dg Chest 2 View  Result Date: 08/01/2018 CLINICAL DATA:  Cough, fever and congestion for 1 week. EXAM: CHEST - 2 VIEW COMPARISON:  None. FINDINGS: Cardiomediastinal silhouette is normal. Mediastinal contours appear intact. There is no evidence of focal airspace consolidation, pleural effusion or pneumothorax. Osseous structures are without acute abnormality. Soft tissues are grossly normal. IMPRESSION: No active cardiopulmonary  disease. Electronically Signed   By: Ted Mcalpine M.D.   On: 08/01/2018 09:54    Procedures Procedures (including critical care time)  Medications Ordered in ED Medications  albuterol (PROVENTIL HFA;VENTOLIN HFA) 108 (90 Base) MCG/ACT inhaler 2 puff (2 puffs Inhalation Given 08/01/18 1018)     Initial Impression / Assessment and Plan / ED Course  I have reviewed the triage vital signs and the nursing notes.  Pertinent labs & imaging results that were available during my care of the patient were reviewed by me and considered in my medical decision making (see chart for details).     27 year old female no sniffing medical history presents with concern for cough, congestion, sinus headaches for 1 week.  Chest x-ray done shows no evidence of pneumonia, pneumothorax, or pulmonary edema.  Suspect patient's chest pain is secondary to cough, she is PERC negative, low suspicion for other etiology.  Suspect likely viral upper respiratory infection as etiology of symptoms.  However, given duration of sinus congestion, sinus headache, feel that she continues to have the symptoms in 2 days, treatment for possible bacterial sinusitis is appropriate.  Given her prescription for Augmentin, told to fill if she is continuing sinus symptoms in 2 days.  Pt with history of smoking, reports wheezing, will give albuterol to use prn cough and wheezing. Patient discharged in stable condition with understanding of reasons to return.   Final Clinical Impressions(s) / ED Diagnoses   Final diagnoses:  Upper respiratory tract infection, unspecified type    ED Discharge Orders         Ordered    amoxicillin-clavulanate (AUGMENTIN) 875-125 MG tablet  2 times daily     08/01/18 1009           Alvira Monday, MD 08/01/18 1022

## 2018-08-01 NOTE — ED Triage Notes (Signed)
Sinus congestion and runny nose for over a week.  Is now coughing up yellow mucous. "My throat gets dry and I have a layer of cold sitting on my tongue".  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

## 2018-08-01 NOTE — ED Notes (Signed)
Patient transported to X-ray 

## 2018-08-01 NOTE — ED Notes (Signed)
ED Provider at bedside. 

## 2018-08-01 NOTE — Discharge Instructions (Addendum)
I have given you a prescription for augmentin for bacterial sinus infection if your sinus symptoms do not improve in 2 days.

## 2020-08-31 ENCOUNTER — Emergency Department (HOSPITAL_BASED_OUTPATIENT_CLINIC_OR_DEPARTMENT_OTHER): Payer: BC Managed Care – PPO

## 2020-08-31 ENCOUNTER — Other Ambulatory Visit: Payer: Self-pay

## 2020-08-31 ENCOUNTER — Emergency Department (HOSPITAL_BASED_OUTPATIENT_CLINIC_OR_DEPARTMENT_OTHER)
Admission: EM | Admit: 2020-08-31 | Discharge: 2020-08-31 | Disposition: A | Discharge status: Home / Self Care | Payer: BC Managed Care – PPO | Attending: Emergency Medicine | Admitting: Emergency Medicine

## 2020-08-31 DIAGNOSIS — J069 Acute upper respiratory infection, unspecified: Secondary | ICD-10-CM

## 2020-08-31 DIAGNOSIS — Z20822 Contact with and (suspected) exposure to covid-19: Secondary | ICD-10-CM | POA: Diagnosis not present

## 2020-08-31 DIAGNOSIS — F17290 Nicotine dependence, other tobacco product, uncomplicated: Secondary | ICD-10-CM | POA: Diagnosis not present

## 2020-08-31 DIAGNOSIS — R0789 Other chest pain: Secondary | ICD-10-CM | POA: Diagnosis not present

## 2020-08-31 DIAGNOSIS — R059 Cough, unspecified: Secondary | ICD-10-CM | POA: Diagnosis present

## 2020-08-31 LAB — RESPIRATORY PANEL BY RT PCR (FLU A&B, COVID)
Influenza A by PCR: NEGATIVE
Influenza B by PCR: NEGATIVE
SARS Coronavirus 2 by RT PCR: NEGATIVE

## 2020-08-31 LAB — TROPONIN I (HIGH SENSITIVITY): Troponin I (High Sensitivity): <2 ng/L (ref <18)

## 2020-08-31 LAB — COMPREHENSIVE METABOLIC PANEL
ALT: 18 U/L (ref 0–44)
AST: 22 U/L (ref 15–41)
Albumin: 3.3 g/dL — ABNORMAL LOW (ref 3.5–5.0)
Alkaline Phosphatase: 48 U/L (ref 38–126)
Anion gap: 8 (ref 5–15)
BUN: 9 mg/dL (ref 6–20)
CO2: 24 mmol/L (ref 22–32)
Calcium: 8.4 mg/dL — ABNORMAL LOW (ref 8.9–10.3)
Chloride: 104 mmol/L (ref 98–111)
Creatinine, Ser: 0.75 mg/dL (ref 0.44–1.00)
GFR, Estimated: >60 mL/min (ref >60)
Glucose, Bld: 85 mg/dL (ref 70–99)
Potassium: 4 mmol/L (ref 3.5–5.1)
Sodium: 136 mmol/L (ref 135–145)
Total Bilirubin: 0.6 mg/dL (ref 0.3–1.2)
Total Protein: 6.7 g/dL (ref 6.5–8.1)

## 2020-08-31 LAB — CBC WITH DIFFERENTIAL/PLATELET
Abs Immature Granulocytes: 0.03 10*3/uL (ref 0.00–0.07)
Basophils Absolute: 0.1 10*3/uL (ref 0.0–0.1)
Basophils Relative: 1 %
Eosinophils Absolute: 0.5 10*3/uL (ref 0.0–0.5)
Eosinophils Relative: 5 %
HCT: 38.5 % (ref 36.0–46.0)
Hemoglobin: 12.7 g/dL (ref 12.0–15.0)
Immature Granulocytes: 0 %
Lymphocytes Relative: 28 %
Lymphs Abs: 2.4 10*3/uL (ref 0.7–4.0)
MCH: 31.8 pg (ref 26.0–34.0)
MCHC: 33 g/dL (ref 30.0–36.0)
MCV: 96.5 fL (ref 80.0–100.0)
Monocytes Absolute: 0.6 10*3/uL (ref 0.1–1.0)
Monocytes Relative: 7 %
Neutro Abs: 5 10*3/uL (ref 1.7–7.7)
Neutrophils Relative %: 59 %
Platelets: 403 10*3/uL — ABNORMAL HIGH (ref 150–400)
RBC: 3.99 MIL/uL (ref 3.87–5.11)
RDW: 11.7 % (ref 11.5–15.5)
WBC: 8.6 10*3/uL (ref 4.0–10.5)
nRBC: 0 % (ref 0.0–0.2)

## 2020-08-31 MED ORDER — BENZONATATE 100 MG PO CAPS: 100.0000 mg | ORAL_CAPSULE | Freq: Three times a day (TID) | ORAL | 0 refills | Status: AC | PRN

## 2020-08-31 NOTE — ED Triage Notes (Signed)
States," Since last week, I been coughing and having chills, with chest pain" test neg for COVID on Tuesday

## 2020-08-31 NOTE — Discharge Instructions (Signed)
Like we discussed, please continue to take Aleve for management of your symptoms.  Please take this with a small amount of food to prevent upsetting your stomach.  Please follow the instructions on the bottle.  I am prescribing a medication called Tessalon Perles that should help with your cough.  You can take these up to 3 times per day.  If your symptoms worsen or do not improve, you can always return to the emergency department for reevaluation.  It was a pleasure to meet you.

## 2020-08-31 NOTE — ED Provider Notes (Signed)
[facility] EMERGENCY DEPARTMENT Provider Note   CSN: [id number] Arrival date & time: 08/31/20  1058     History Chief Complaint  Patient presents with  . Cough    Victoria Pruitt is a 29 y.o. female.  HPI Patient is a 29 year old female with a medical history as noted below.  Patient states about 1 week ago she began experiencing rhinorrhea and congestion.  Over the past week her symptoms continued with associated dry cough, chills, body aches.  After she began coughing she began experiencing back pain that she states is worsened and now includes pain throughout her torso.  Pain worsens with coughing.  She had a COVID-19 test earlier in the week that was negative.  She was vaccinated with the [employer] vaccine earlier in the year.  She denies fevers, sore throat, drooling, shortness of breath, vomiting, urinary changes, leg swelling, calf pain.    Past Medical History:  Diagnosis Date  . Obesity     There are no problems to display for this patient.   Past Surgical History:  Procedure Laterality Date  . TOE SURGERY       OB History   No obstetric history on file.     No family history on file.  Social History   Tobacco Use  . Smoking status: Current Every Day Smoker    Types: Cigars  . Smokeless tobacco: Never Used  Substance Use Topics  . Alcohol use: Yes    Comment: 1 drink /weekends  . Drug use: No    Home Medications Prior to Admission medications   Not on File    Allergies    Patient has no known allergies.  Review of Systems   Review of Systems  All other systems reviewed and are negative. Ten systems reviewed and are negative for acute change, except as noted in the HPI.    Physical Exam Updated Vital Signs BP 100/84 (BP Location: Right Arm)   Pulse 79   Temp 98.3 F (36.8 C) (Oral)   Resp 18   Ht 5\' 5"  (1.651 m)   Wt 117.9 kg   LMP 08/30/2020   SpO2 100%   BMI 43.27 kg/m   Physical Exam Vitals and nursing  note reviewed.  Constitutional:      General: She is not in acute distress.    Appearance: Normal appearance. She is not ill-appearing, toxic-appearing or diaphoretic.  HENT:     Head: Normocephalic and atraumatic.     Right Ear: Tympanic membrane, ear canal and external ear normal. There is no impacted cerumen.     Left Ear: Tympanic membrane, ear canal and external ear normal. There is no impacted cerumen.     Nose: Nose normal.     Mouth/Throat:     Mouth: Mucous membranes are moist.     Pharynx: Oropharynx is clear. No oropharyngeal exudate or posterior oropharyngeal erythema.     Comments: Uvula midline.  No significant erythema noted in the posterior oropharynx.  No exudates.  Readily handling secretions.  No hot potato voice. Eyes:     General: No scleral icterus.       Right eye: No discharge.        Left eye: No discharge.     Extraocular Movements: Extraocular movements intact.     Conjunctiva/sclera: Conjunctivae normal.  Cardiovascular:     Rate and Rhythm: Normal rate and regular rhythm.     Pulses: Normal pulses.     Heart sounds:  Normal heart sounds. No murmur heard.  No friction rub. No gallop.      Comments: Regular rate and rhythm without murmurs, rubs, or gallops.  Mild TTP noted with palpation of the anterior chest wall. Pulmonary:     Effort: Pulmonary effort is normal. No respiratory distress.     Breath sounds: Normal breath sounds. No stridor. No wheezing, rhonchi or rales.     Comments: Lungs are clear to auscultation bilaterally. Abdominal:     General: Abdomen is flat.     Palpations: Abdomen is soft.     Tenderness: There is no abdominal tenderness.  Musculoskeletal:        General: Normal range of motion.     Cervical back: Normal range of motion and neck supple. No tenderness.  Skin:    General: Skin is warm and dry.  Neurological:     General: No focal deficit present.     Mental Status: She is alert and oriented to person, place, and time.   Psychiatric:        Mood and Affect: Mood normal.        Behavior: Behavior normal.    ED Results / Procedures / Treatments   Labs (all labs ordered are listed, but only abnormal results are displayed) Labs Reviewed  CBC WITH DIFFERENTIAL/PLATELET - Abnormal; Notable for the following components:      Result Value   Platelets 403 (*)    All other components within normal limits  COMPREHENSIVE METABOLIC PANEL - Abnormal; Notable for the following components:   Calcium 8.4 (*)    Albumin 3.3 (*)    All other components within normal limits  RESPIRATORY PANEL BY RT PCR (FLU A&B, COVID)  TROPONIN I (HIGH SENSITIVITY)   EKG EKG Interpretation  Date/Time:  Sunday August 31 2020 11:12:00 EST Ventricular Rate:  67 PR Interval:    QRS Duration: 94 QT Interval:  396 QTC Calculation: 418 R Axis:   47 Text Interpretation: Sinus rhythm Low voltage, precordial leads No previous ECGs available Confirmed by Vanetta Mulders [phone]) on 08/31/2020 12:11:40 PM   Radiology DG Chest Portable 1 View  Result Date: 08/31/2020 CLINICAL DATA:  Cough for 1 week. EXAM: PORTABLE CHEST 1 VIEW COMPARISON:  Chest radiograph 08/11/2018 FINDINGS: The cardiomediastinal contours are within normal limits. The lungs are clear. No pneumothorax or large pleural effusion. No acute finding in the visualized skeleton. IMPRESSION: No acute cardiopulmonary process. Electronically Signed   By: Emmaline Kluver M.D.   On: 08/31/2020 11:49    Procedures Procedures (including critical care time)  Medications Ordered in ED Medications - No data to display  ED Course  I have reviewed the triage vital signs and the nursing notes.  Pertinent labs & imaging results that were available during my care of the patient were reviewed by me and considered in my medical decision making (see chart for details).  Clinical Course as of Aug 31 1326  Wynelle Link Aug 31, 2020  1155 No acute cardiopulmonary process.  DG Chest Portable  1 View [LJ]  1252 SARS Coronavirus 2 by RT PCR: NEGATIVE [LJ]  1253 Influenza A By PCR: NEGATIVE [LJ]  1253 Influenza B By PCR: NEGATIVE [LJ]  1253 Troponin I (High Sensitivity): <2 [LJ]  1253 Sinus rhythm with low voltage in the precordial leads.  EKG 12-Lead [LJ]    Clinical Course User Index [LJ] Placido Sou, PA-C   MDM Rules/Calculators/A&P  Pt is a 29 y.o. female that presents with a history, physical exam, and ED Clinical Course as noted above.   Patient presents today with symptoms that seem to be consistent with a viral URI.  Patient states she started experiencing upper back and chest pain after she began coughing a few days ago.  She has palpable pain on my exam along the anterior chest wall.  No leg swelling or calf pain.  Heart is regular rate and rhythm.  No tachycardia.  She has been vaccinated for COVID-19.  Her respiratory panel today is negative.  Given her symptoms, I obtained basic labs as well as a troponin.  These are all reassuring.  Troponin less than 2.  CBC without leukocytosis.  Normal electrolytes on CMP.  Chest x-ray and ECG are both reassuring.  Believe her symptoms are likely secondary to pleurisy.  Recommended that she begin taking naproxen at home.  She has this.  We discussed dosing.  Patient given a prescription for [facility] as well.  Understands to return to the ED if she develops new or worsening symptoms.  She states she will do so.  Her questions were answered and she is amicable at the time of discharge.  Her vital signs are stable.  She is afebrile, not tachycardic, normotensive, not hypoxic.  Patient is hemodynamically stable and in NAD at the time of d/c. Evaluation does not show pathology that would require ongoing emergent intervention or inpatient treatment. I explained the diagnosis to the patient. Patient is comfortable with above plan and is stable for discharge at this time. All questions were answered prior  to disposition. Strict return precautions for returning to the ED were discussed. Encouraged follow up with PCP.    An After Visit Summary was printed and given to the patient.  Patient discharged to home/self care.  Condition at discharge: Stable  Note: Portions of this report may have been transcribed using voice recognition software. Every effort was made to ensure accuracy; however, inadvertent computerized transcription errors may be present.   Final Clinical Impression(s) / ED Diagnoses Final diagnoses:  Viral URI with cough  Chest wall pain    Rx / DC Orders ED Discharge Orders         Ordered    benzonatate (TESSALON) 100 MG capsule  3 times daily PRN        08/31/20 1326           Placido Sou, PA-C 08/31/20 1329    Vanetta Mulders, MD 09/30/20 1443

## 2021-06-27 IMAGING — DX DG CHEST 1V PORT
1 series · 1 of 1 positions shown · non-contrast
Comparison: Chest radiograph 08/11/2018

CLINICAL DATA: Cough for 1 week.

EXAM:
PORTABLE CHEST 1 VIEW

[chest ap]
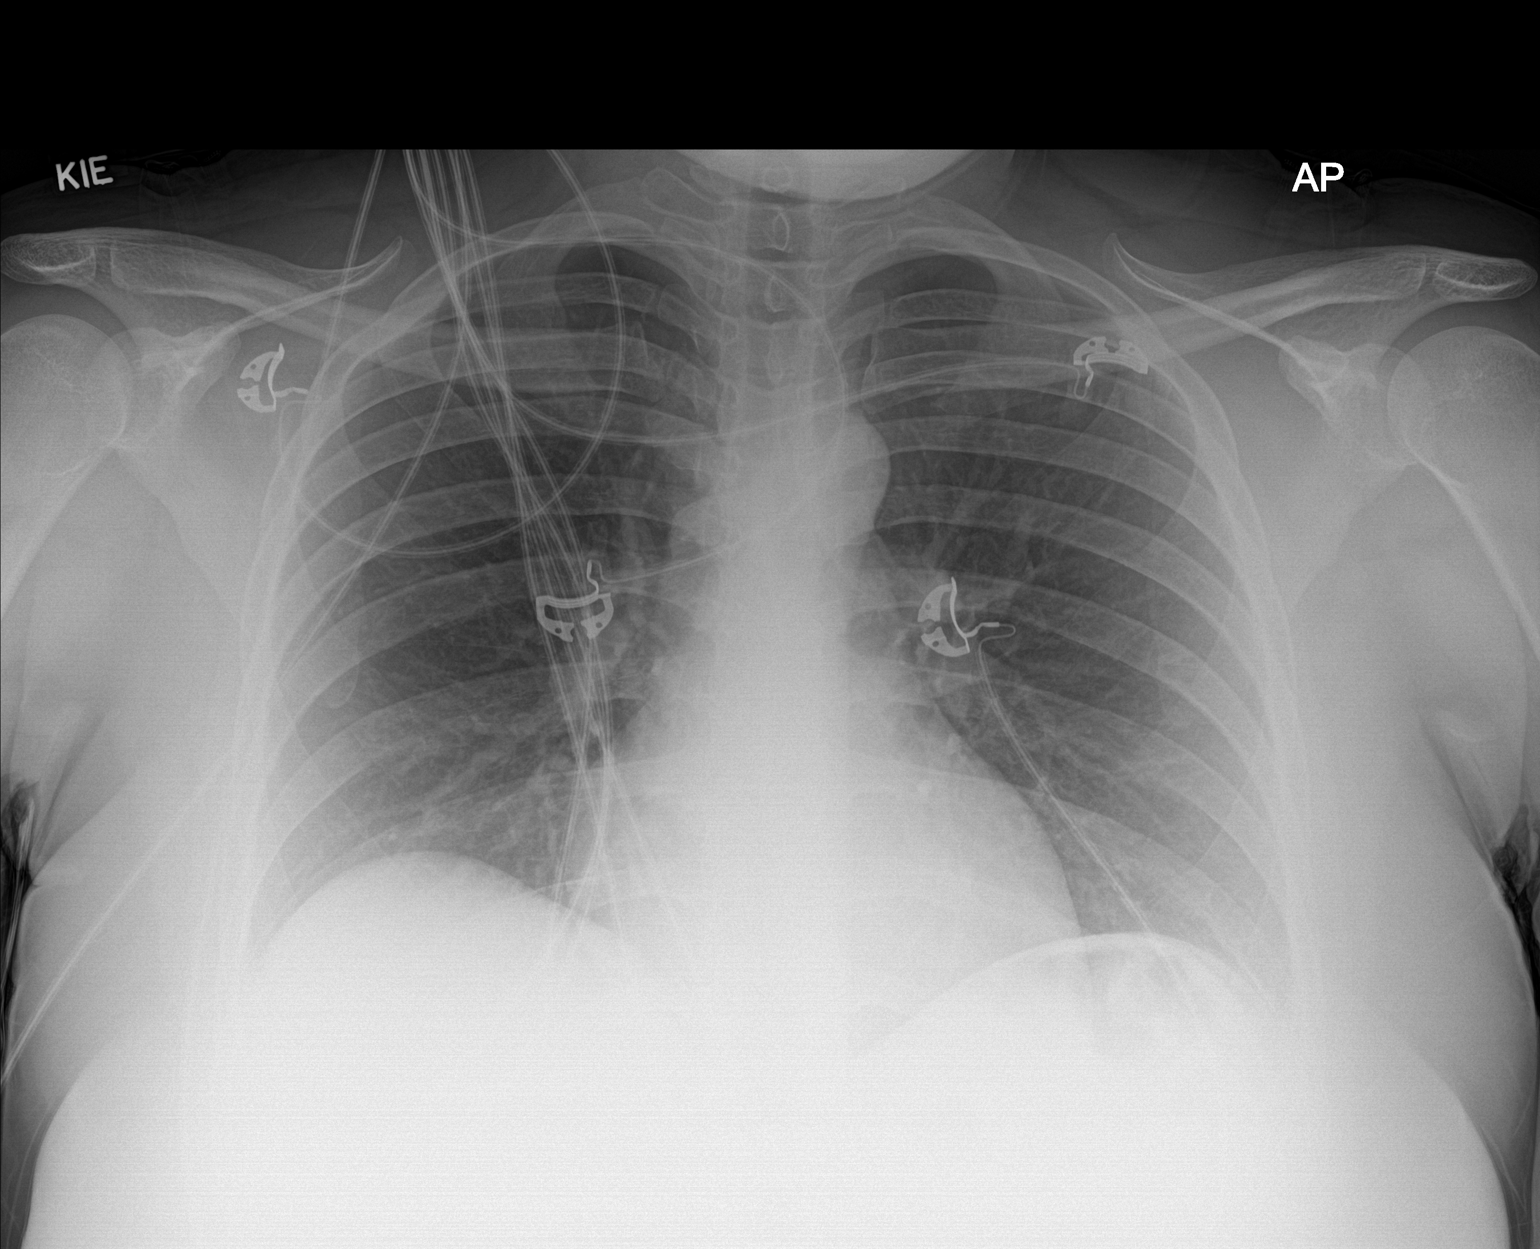

[1 of 1 positions shown; findings below may reference images not displayed]

FINDINGS: The cardiomediastinal contours are within normal limits. The lungs
are clear. No pneumothorax or large pleural effusion. No acute
finding in the visualized skeleton.
IMPRESSION: No acute cardiopulmonary process.
